# Patient Record
Sex: Male | Born: 1985 | Race: Black or African American | Hispanic: No | Marital: Single
Health system: Southern US, Community
[De-identification: ages and names within clinical notes are randomized; demographics above are authoritative.]

---

## 2006-11-19 ENCOUNTER — Emergency Department (HOSPITAL_COMMUNITY): Admission: EM | Admit: 2006-11-19 | Discharge: 2006-11-19 | Payer: Self-pay | Admitting: Family Medicine

## 2006-12-27 ENCOUNTER — Emergency Department (HOSPITAL_COMMUNITY): Admission: EM | Admit: 2006-12-27 | Discharge: 2006-12-27 | Payer: Self-pay | Admitting: Emergency Medicine

## 2007-01-01 ENCOUNTER — Emergency Department (HOSPITAL_COMMUNITY): Admission: EM | Admit: 2007-01-01 | Discharge: 2007-01-01 | Payer: Self-pay | Admitting: Emergency Medicine

## 2007-01-21 ENCOUNTER — Emergency Department (HOSPITAL_COMMUNITY): Admission: EM | Admit: 2007-01-21 | Discharge: 2007-01-21 | Payer: Self-pay | Admitting: Emergency Medicine

## 2007-08-23 ENCOUNTER — Emergency Department (HOSPITAL_COMMUNITY): Admission: EM | Admit: 2007-08-23 | Discharge: 2007-08-23 | Payer: Self-pay | Admitting: Family Medicine

## 2007-08-24 ENCOUNTER — Emergency Department (HOSPITAL_COMMUNITY): Admission: EM | Admit: 2007-08-24 | Discharge: 2007-08-24 | Payer: Self-pay | Admitting: Emergency Medicine

## 2008-01-26 IMAGING — CR DG CHEST 2V
2 series · 2 of 2 positions shown · non-contrast
Comparison: none

CLINICAL DATA: chest pain

Chest 2 view:
No previous for comparison. The heart size and mediastinal contours are within
normal limits.  Both lungs are clear.  The visualized skeletal structures are
unremarkable.

[view not recorded (1 of 2)]
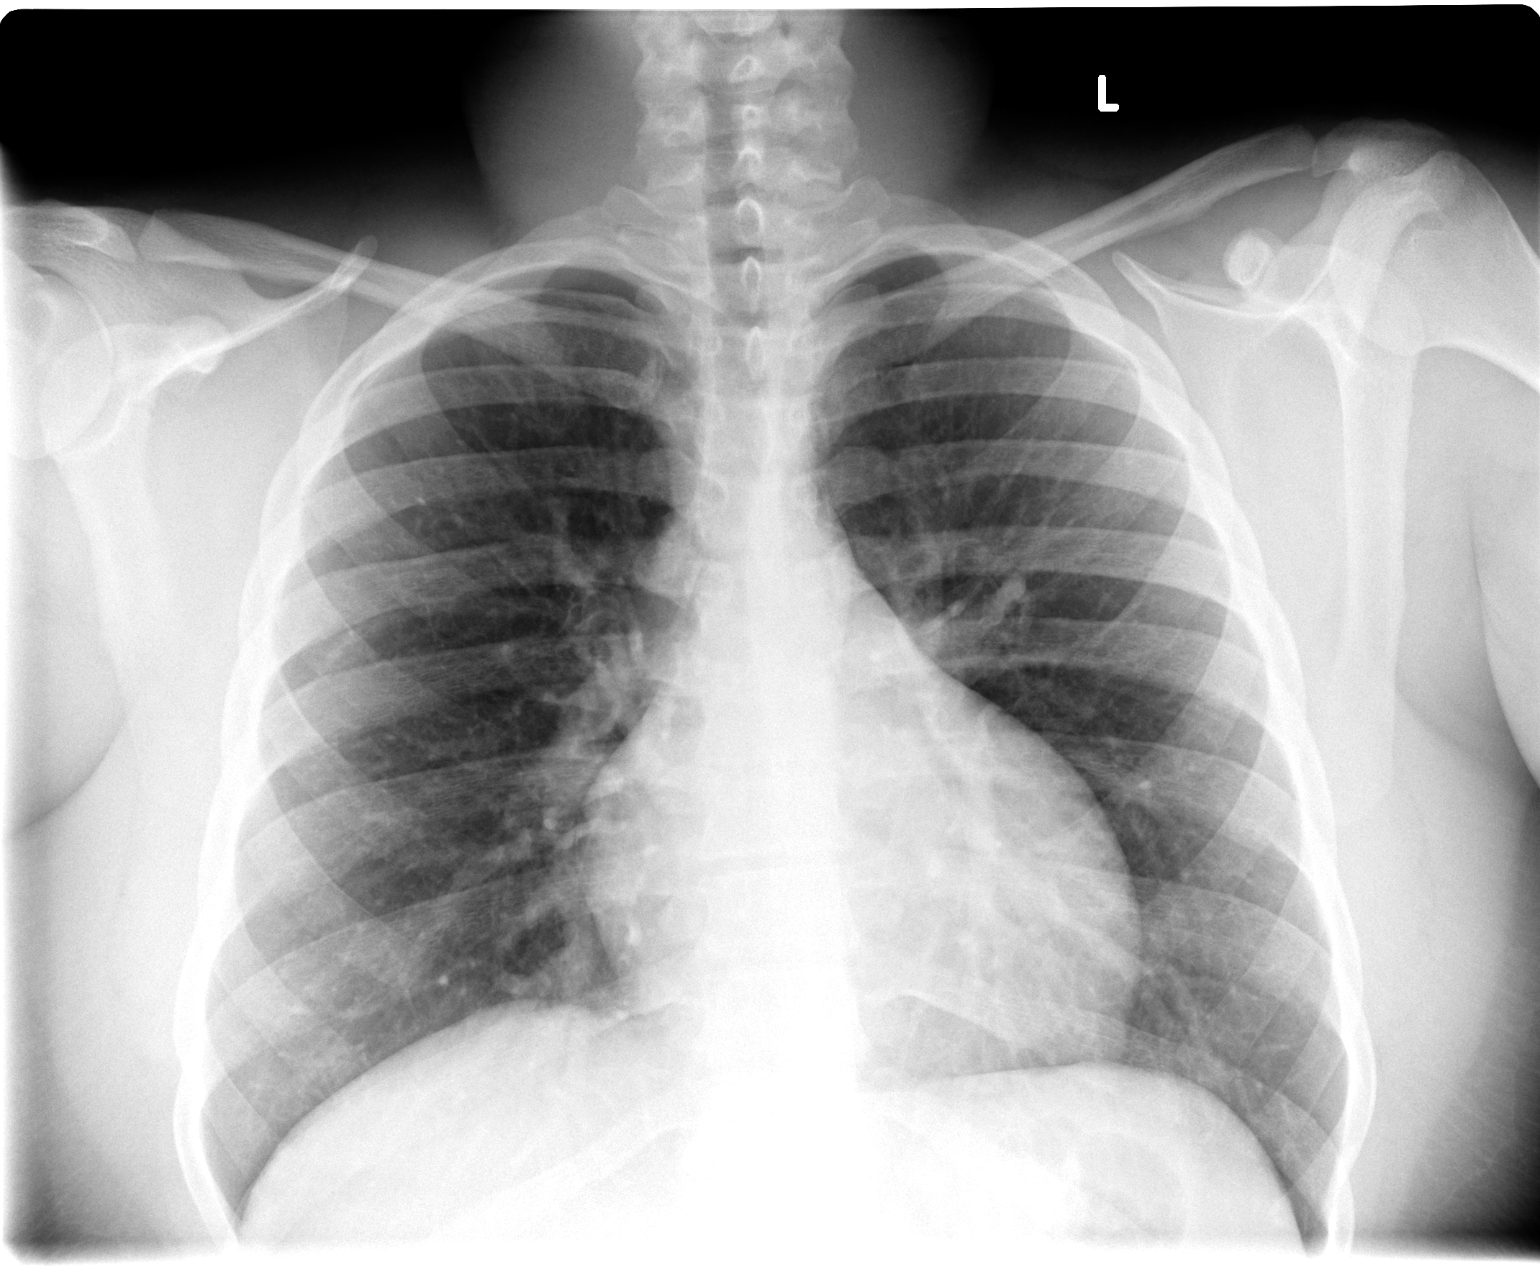

[view not recorded (2 of 2)]
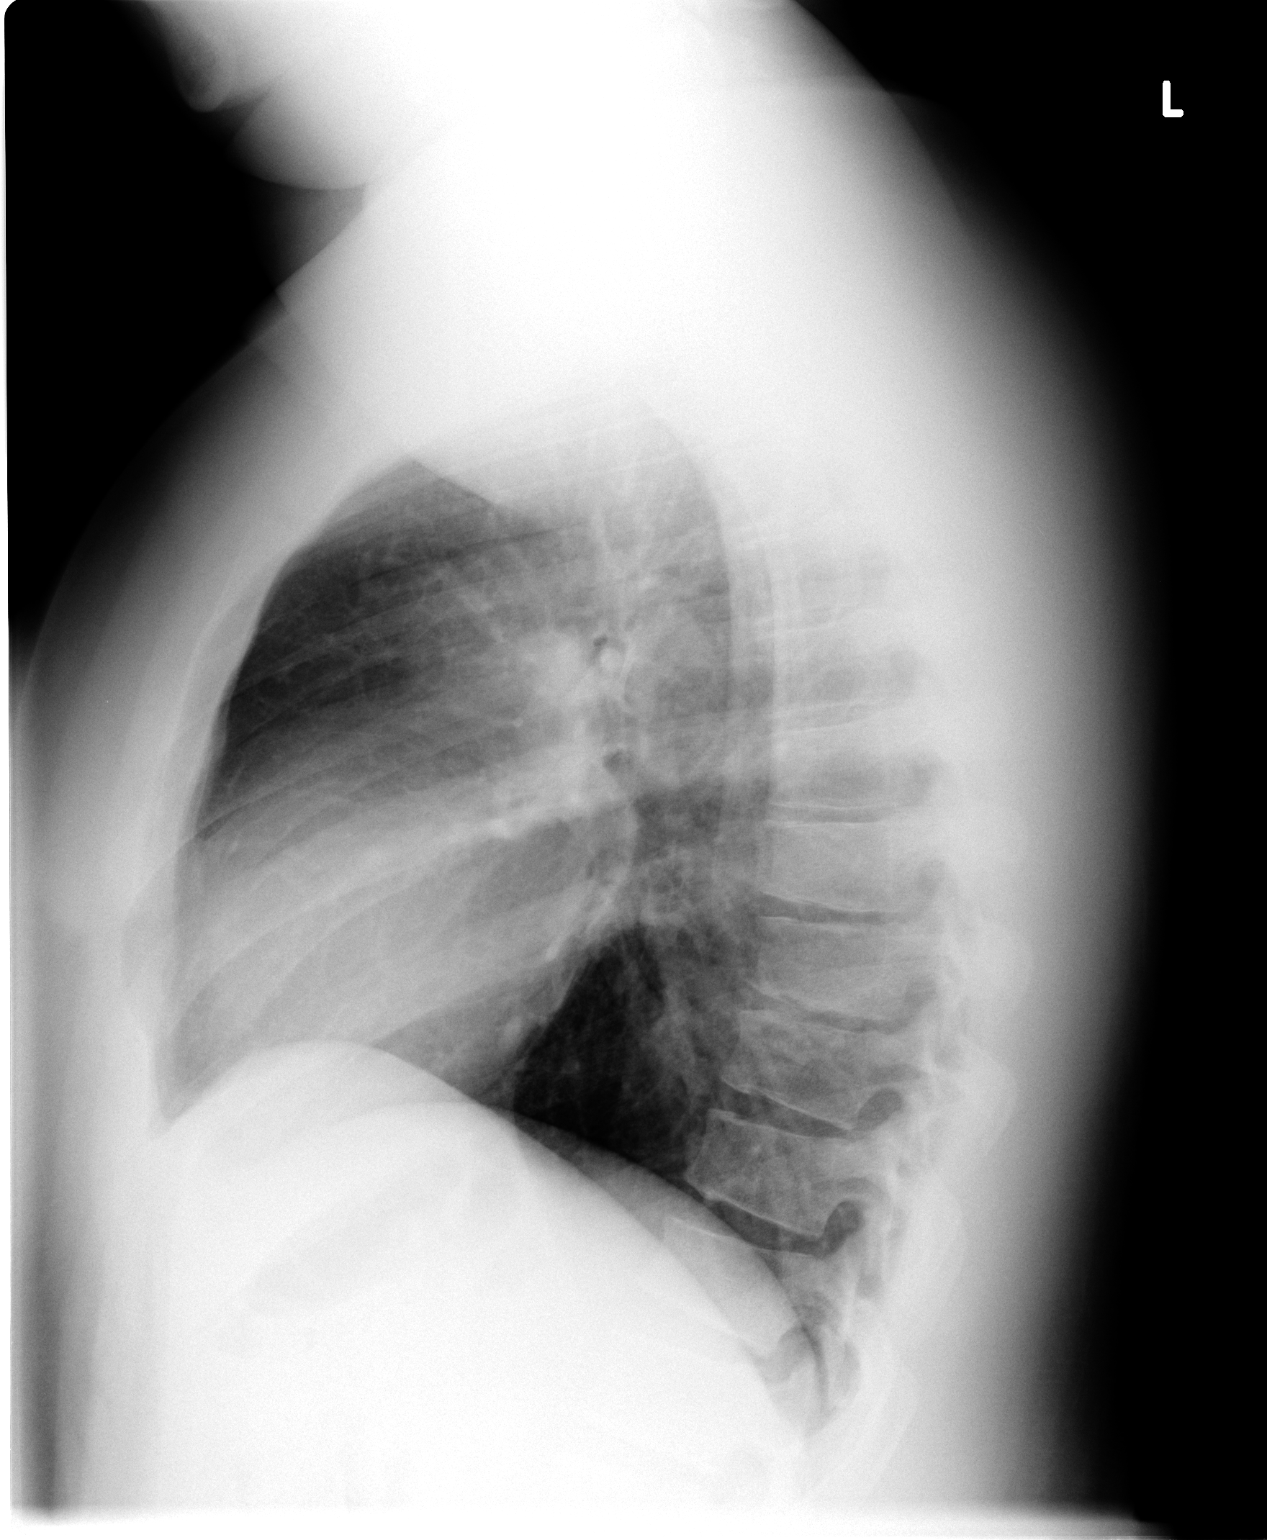

[2 of 2 positions shown; findings below may reference images not displayed]

IMPRESSION: 1. No active cardiopulmonary disease.

## 2008-07-10 ENCOUNTER — Emergency Department (HOSPITAL_COMMUNITY): Admission: EM | Admit: 2008-07-10 | Discharge: 2008-07-10 | Payer: Self-pay | Admitting: Emergency Medicine

## 2008-07-15 ENCOUNTER — Emergency Department (HOSPITAL_COMMUNITY): Admission: EM | Admit: 2008-07-15 | Discharge: 2008-07-15 | Payer: Self-pay | Admitting: Family Medicine

## 2011-04-26 ENCOUNTER — Inpatient Hospital Stay (INDEPENDENT_AMBULATORY_CARE_PROVIDER_SITE_OTHER)
Admission: RE | Admit: 2011-04-26 | Discharge: 2011-04-26 | Disposition: A | Discharge status: Home / Self Care | Payer: Commercial Indemnity | Source: Ambulatory Visit | Attending: Emergency Medicine | Admitting: Emergency Medicine

## 2011-04-26 DIAGNOSIS — A64 Unspecified sexually transmitted disease: Secondary | ICD-10-CM

## 2011-04-27 LAB — GC/CHLAMYDIA PROBE AMP, GENITAL: Chlamydia, DNA Probe: NEGATIVE

## 2021-06-04 ENCOUNTER — Other Ambulatory Visit (HOSPITAL_COMMUNITY): Payer: Self-pay

## 2022-11-30 ENCOUNTER — Emergency Department (HOSPITAL_COMMUNITY)
Admission: EM | Admit: 2022-11-30 | Discharge: 2022-11-30 | Disposition: A | Discharge status: Home / Self Care | Payer: Commercial Managed Care - PPO | Attending: Emergency Medicine | Admitting: Emergency Medicine

## 2022-11-30 ENCOUNTER — Other Ambulatory Visit: Payer: Self-pay

## 2022-11-30 ENCOUNTER — Encounter (HOSPITAL_COMMUNITY): Payer: Self-pay

## 2022-11-30 ENCOUNTER — Emergency Department (HOSPITAL_COMMUNITY): Payer: Commercial Managed Care - PPO

## 2022-11-30 DIAGNOSIS — M7702 Medial epicondylitis, left elbow: Secondary | ICD-10-CM | POA: Diagnosis not present

## 2022-11-30 DIAGNOSIS — M25522 Pain in left elbow: Secondary | ICD-10-CM | POA: Diagnosis present

## 2022-11-30 MED ORDER — KETOROLAC TROMETHAMINE 30 MG/ML IJ SOLN
30.0000 mg | Freq: Once | INTRAMUSCULAR | Status: AC
  Administered 2022-11-30: 30 mg via INTRAMUSCULAR
  Filled 2022-11-30: qty 1

## 2022-11-30 NOTE — ED Notes (Signed)
Registration reports patient walked to car and will let me know when patient returns

## 2022-11-30 NOTE — ED Provider Triage Note (Signed)
Emergency Medicine Provider Triage Evaluation Note  Kenneth Miles , a 36 y.o. male  was evaluated in triage.  Pt complains of left elbow pain for the last 48 hours.  States he does not know what happened and may have slept on it wrong.  Denies any trauma recent surgeries.  Denies any redness, swelling to the area states it hurts more when he turns his elbow and tries to flex his elbow.  No history of blood clots.  Pain is worse with range of motion.  Tried ibuprofen without relief.  Review of Systems  Positive: L elbow pain Negative: Swelling, rash  Physical Exam  BP (!) 160/96 (BP Location: Right Arm)   Pulse 97   Temp 98.2 F (36.8 C) (Oral)   Resp 16   SpO2 100%  Gen:   Awake, no distress   Resp:  Normal effort  MSK:   Moves extremities without difficulty  Other:  Tenderness to palpation of medial epicondyle, range of motion intact, however he has more pain with supination and flexion of the LUE.  No erythema or edema to the arm.  Medical Decision Making  Medically screening exam initiated at 7:34 AM.  Appropriate orders placed.  Kenneth Miles was informed that the remainder of the evaluation will be completed by another provider, this initial triage assessment does not replace that evaluation, and the importance of remaining in the ED until their evaluation is complete.    Osvaldo Shipper, [place] 11/30/22 [phone]

## 2022-11-30 NOTE — ED Triage Notes (Signed)
C/o left elbow pain with decreased ROM x2 days. Ibuprofen w/o relief.  Denies fall, trauma.  Denies redness, swelling, heat, fever, body ache, chills.

## 2022-11-30 NOTE — ED Provider Notes (Signed)
[facility] DEPT Provider Note   CSN: [id number] Arrival date & time: 11/30/22  0709     History  Chief Complaint  Patient presents with   Extremity Pain    Kenneth Miles is a 37 y.o. male, no pertinent past medical history, presents to the ED secondary to left elbow pain for the last 48 hours.  He states he woke up, and he had left elbow pain, and thinks he may have slept on it wrong.  Notes that he has not had any recent surgeries, no trauma to it.  Has not any redness or swelling to the area, but states that it hurts more when he moves his elbow.  He also states he has no history of blood clots or blood clotting conditions.  Has tried ibuprofen without relief and states heat helps a little bit.  Denies any cuts to the area.  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     Home Medications Prior to Admission medications   Not on File      Allergies    Patient has no known allergies.    Review of Systems   Review of Systems  Musculoskeletal:        +L elbow pain  Skin:  Negative for rash and wound.    Physical Exam Updated Vital Signs BP (!) 160/96 (BP Location: Right Arm)   Pulse 97   Temp 98.2 F (36.8 C) (Oral)   Resp 16   SpO2 100%  Physical Exam Vitals and nursing note reviewed.  Constitutional:      General: He is not in acute distress.    Appearance: He is well-developed.  HENT:     Head: Normocephalic and atraumatic.  Eyes:     General:        Right eye: No discharge.        Left eye: No discharge.     Conjunctiva/sclera: Conjunctivae normal.  Cardiovascular:     Rate and Rhythm: Normal rate and regular rhythm.     Pulses: Normal pulses.  Pulmonary:     Effort: No respiratory distress.  Musculoskeletal:     Right elbow: Normal.     Left elbow: No deformity, effusion or lacerations. Decreased range of motion. Tenderness present.     Comments: Tenderness to palpation of the medial  epicondyles, with worsening pain with flexion of lower arm, and supination.  He has difficulty extending all the way secondary to pain.  No effusion noted however and there is no erythema or edema.  Neurological:     Mental Status: He is alert.     Comments: Clear speech.   Psychiatric:        Behavior: Behavior normal.        Thought Content: Thought content normal.     ED Results / Procedures / Treatments   Labs (all labs ordered are listed, but only abnormal results are displayed) Labs Reviewed - No data to display  EKG None  Radiology DG Elbow Complete Left  Result Date: 11/30/2022 CLINICAL DATA:  Pain with supination.  No known trauma. EXAM: LEFT ELBOW - COMPLETE 3+ VIEW COMPARISON:  None Available. FINDINGS: Osseous alignment is normal. Bone mineralization is normal. No fracture line or displaced fracture fragment is seen. No focal cortical irregularity or osseous lesion. No degenerative change or erosion is seen. No secondary evidence of joint effusion. Overlying soft tissues are unremarkable. IMPRESSION: Normal plain  film examination of the left elbow. Electronically Signed   By: Franki Cabot M.D.   On: 11/30/2022 07:57    Procedures Procedures    Medications Ordered in ED Medications  ketorolac (TORADOL) 30 MG/ML injection 30 mg (30 mg Intramuscular Given 11/30/22 0740)    ED Course/ Medical Decision Making/ A&P                           Medical Decision Making Patient is a 37 year old male, here for left elbow pain with no known trauma.  States it hurts more to move it.  We will obtain x-ray for further evaluation.  Toradol for pain.  Amount and/or Complexity of Data Reviewed Radiology: ordered.    Details: X-ray unremarkable. Discussion of management or test interpretation with external provider(s): Here for left elbow pain for the last 48 hours, no known trauma.  Painful with supination and extension and flexion of arm.  Reduced extension secondary to pain.  He  has no edema, erythema of the arm or the elbow.  I am suspicious for medial epicondylitis, secondary to his to his tenderness of the medial epicondyle and the repetitive nature of his work.  We discussed follow-up with primary care, ice, Tylenol and ibuprofen for pain control.  Return precautions such as swelling of the arm, redness or worsening range of motion were emphasized.  Risk Prescription drug management.   Final Clinical Impression(s) / ED Diagnoses Final diagnoses:  Left elbow pain  Golfer's elbow, left    Rx / DC Orders ED Discharge Orders     None         Carmita Boom, [place], [place] 11/30/22 0827    Isla Pence, MD 11/30/22 [phone]

## 2022-11-30 NOTE — Discharge Instructions (Addendum)
Please use ice, Tylenol, and ibuprofen for pain control.  Make sure you are resting your arm, you can use an Ace bandage to help with that support.  If you develop swelling, redness of your arm please return to the ER.  Please follow-up with your primary care doctor for further evaluation.  Attempt to stop doing repetitive motions as this may aggravate your pain more.
